# Patient Record
Sex: Male | Born: 2005 | Race: Black or African American | Hispanic: No | Smoking: Never smoker
Health system: Southern US, Community
[De-identification: ages and names within clinical notes are randomized; demographics above are authoritative.]

## PROBLEM LIST (undated history)

## (undated) DIAGNOSIS — L309 Dermatitis, unspecified: Secondary | ICD-10-CM

## (undated) DIAGNOSIS — S42301A Unspecified fracture of shaft of humerus, right arm, initial encounter for closed fracture: Secondary | ICD-10-CM

## (undated) DIAGNOSIS — T7840XA Allergy, unspecified, initial encounter: Secondary | ICD-10-CM

## (undated) HISTORY — DX: Allergy, unspecified, initial encounter: T78.40XA

## (undated) HISTORY — DX: Dermatitis, unspecified: L30.9

## (undated) HISTORY — PX: OTHER SURGICAL HISTORY: SHX169

---
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
## 2025-01-10 ENCOUNTER — Encounter: Payer: Self-pay | Admitting: Emergency Medicine

## 2025-01-10 ENCOUNTER — Ambulatory Visit
Admission: EM | Admit: 2025-01-10 | Discharge: 2025-01-10 | Disposition: A | Discharge status: Home / Self Care | Attending: Family Medicine | Admitting: Family Medicine

## 2025-01-10 DIAGNOSIS — A084 Viral intestinal infection, unspecified: Secondary | ICD-10-CM | POA: Diagnosis not present

## 2025-01-10 MED ORDER — ONDANSETRON 4 MG PO TBDP: 4.0000 mg | ORAL_TABLET | Freq: Three times a day (TID) | ORAL | 0 refills | Status: AC | PRN

## 2025-01-10 MED ORDER — ONDANSETRON 4 MG PO TBDP
4.0000 mg | ORAL_TABLET | Freq: Once | ORAL | Status: AC
  Administered 2025-01-10: 4 mg via ORAL

## 2025-01-10 NOTE — Discharge Instructions (Signed)
 You were given Zofran  in the clinic.  You may take this every 8 hours as needed for nausea or vomiting.  Focus on hydration/electrolyte replacement with Gatorade, Powerade, Pedialyte, water.  Focus on a bland diet such as bananas, rice, applesauce, toast and advance as you tolerate.  Lots of rest.  Follow-up with your PCP in 2 to 3 days for recheck.  Please go to the emergency room if you develop any worsening symptoms which include but not limited to inability to remain hydrated, fever you cannot manage with over-the-counter medications, or any new concerns that arise.  I hope you feel better soon!

## 2025-01-10 NOTE — ED Provider Notes (Signed)
 " [facility]    CSN: [id number] Arrival date & time: 01/10/25  1353      History   Chief Complaint Chief Complaint  Patient presents with   Emesis    HPI Terry [name] is a 19 y.o. male  presents for evaluation of URI symptoms for 3 days. Patient reports associated symptoms of 1-2 episodes per day of nonbilious nonbloody vomiting and diarrhea. Denies fevers, cough or congestion, sore throat, body aches, ear pain or shortness of breath.  No history of Crohn's, IBS, colitis.  No recent travel.  No change in medications or diet.  Reports no known sick contacts.  Pt has taken thing OTC for symptoms.  States he cannot keep any fluids down but reports he is still urinating normally.  Pt has no other concerns at this time.    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 Authorizing Provider  ondansetron  (ZOFRAN -ODT) 4 MG disintegrating tablet Take 1 tablet (4 mg total) by mouth every 8 (eight) hours as needed for nausea or vomiting. 01/10/25  Yes Giavonna Pflum, Jodi R, NP  cetirizine  (ZYRTEC ) 10 MG tablet Can take one tablet by mouth once daily as needed. 03/31/20   Kozlow, Camellia PARAS, MD  EPINEPHrine  0.3 mg/0.3 mL IJ SOAJ injection Use as directed for life-threatening allergic reaction. 03/31/20   Kozlow, Camellia PARAS, MD  fluticasone  (FLOVENT  HFA) 44 MCG/ACT inhaler Inhale 2 puffs into the lungs 2 (two) times daily.    [provider]  Multiple Vitamin (MULTIVITAMIN) tablet Take 1 tablet by mouth daily.     [provider]  omeprazole  (PRILOSEC) 20 MG capsule Take 1 capsule (20 mg total) by mouth daily. 08/21/18   Kozlow, Camellia PARAS, MD  PROAIR  HFA 108 (90 Base) MCG/ACT inhaler INHALE 2 PUFFS BY MOUTH EVERY 4 HOURS AS NEEDED FOR WHEEZING/SHORTNESS OF BREATH 03/31/20   Kozlow, Camellia PARAS, MD    Family History Family History  Problem Relation Age of Onset   Mental retardation Paternal Aunt        down syndrome   Cancer Maternal Grandfather 24       cancer   Diabetes Paternal Grandfather    Allergic rhinitis Mother     Social History Social History[1]   Allergies   Peanut-containing drug products, Dairy aid [tilactase], and Other   Review of Systems Review of Systems  Gastrointestinal:  Positive for diarrhea, nausea and vomiting.     Physical Exam Triage Vital Signs ED Triage Vitals  Encounter Vitals Group     BP 01/10/25 1444 113/76     Girls Systolic BP Percentile --      Girls Diastolic BP Percentile --      Boys Systolic BP Percentile --      Boys Diastolic BP Percentile --      Pulse Rate 01/10/25 1444 89     Resp 01/10/25 1444 16  Temp 01/10/25 1444 98.2 F (36.8 C)     Temp Source 01/10/25 1444 Oral     SpO2 01/10/25 1444 96 %     Weight --      Height --      Head Circumference --      Peak Flow --      Pain Score 01/10/25 1443 3     Pain Loc --      Pain Education --      Exclude from Growth Chart --    No data found.  Updated Vital Signs BP 113/76 (BP Location: Left Arm)   Pulse 89   Temp 98.2 F (36.8 C) (Oral)   Resp 16   SpO2 96%   Visual Acuity Right Eye Distance:   Left Eye Distance:   Bilateral Distance:    Right Eye Near:   Left Eye Near:    Bilateral Near:     Physical Exam Vitals and nursing note reviewed.  Constitutional:      General: He is not in acute distress.    Appearance: Normal appearance. He is not ill-appearing.  HENT:     Head: Normocephalic and atraumatic.  Eyes:     Pupils: Pupils are equal, round, and  reactive to light.  Cardiovascular:     Rate and Rhythm: Normal rate.  Pulmonary:     Effort: Pulmonary effort is normal.  Abdominal:     General: Abdomen is flat. Bowel sounds are normal. There is no distension.     Palpations: Abdomen is soft. There is no hepatomegaly or splenomegaly.     Tenderness: There is no abdominal tenderness. There is no guarding or rebound.  Skin:    General: Skin is warm and dry.  Neurological:     General: No focal deficit present.     Mental Status: He is alert and oriented to person, place, and time.  Psychiatric:        Mood and Affect: Mood normal.        Behavior: Behavior normal.      UC Treatments / Results  Labs (all labs ordered are listed, but only abnormal results are displayed) Labs Reviewed - No data to display  EKG   Radiology No results found.  Procedures Procedures (including critical care time)  Medications Ordered in UC Medications  ondansetron  (ZOFRAN -ODT) disintegrating tablet 4 mg (has no administration in time range)    Initial Impression / Assessment and Plan / UC Course  I have reviewed the triage vital signs and the nursing notes.  Pertinent labs & imaging results that were available during my care of the patient were reviewed by me and considered in my medical decision making (see chart for details).     Reviewed exam and symptoms with patient.  No red flags.  He is well-appearing and in no acute distress.  Discussed exam symptoms consistent with viral enteritis, Zofran  given in clinic and Rx sent to pharmacy.  Discussed hydration/electrolyte replacement and bland diet.  PCP follow-up 2 to 3 days for recheck.  ER precautions reviewed. Final Clinical Impressions(s) / UC Diagnoses   Final diagnoses:  Viral gastroenteritis     Discharge Instructions      You were given Zofran  in the clinic.  You may take this every 8 hours as needed for nausea or vomiting.  Focus on hydration/electrolyte replacement with  Gatorade, Powerade, Pedialyte, water.  Focus on a bland diet such as bananas, rice, applesauce, toast and advance as you tolerate.  Lots of rest.  Follow-up with your PCP in 2 to 3 days for recheck.  Please go to the emergency room if you develop any worsening symptoms which include but not limited to inability to remain hydrated, fever you cannot manage with over-the-counter medications, or any new concerns that arise.  I hope you feel better soon!    ED Prescriptions     Medication Sig Dispense Auth. Provider   ondansetron  (ZOFRAN -ODT) 4 MG disintegrating tablet Take 1 tablet (4 mg total) by mouth every 8 (eight) hours as needed for nausea or vomiting. 10 tablet Chika Cichowski, Jodi R, NP      PDMP not reviewed this encounter.    [1]  Social History Tobacco Use   Smoking status: Never   Smokeless tobacco: Never  Vaping Use   Vaping status: Never Used  Substance Use Topics   Alcohol use: No   Drug use: No     Loreda Myla SAUNDERS, NP 01/10/25 1514  "

## 2025-01-10 NOTE — ED Triage Notes (Signed)
 Patient presents for vomiting x 3 days.  Patient denies fever.  Patient is having some cramping.  Patient has not taken any medication.
